# Patient Record
Sex: Male | Born: 1970 | Race: White | Hispanic: No | Marital: Married | Smoking: Never smoker
Health system: Southern US, Community
[De-identification: ages and names within clinical notes are randomized; demographics above are authoritative.]

## PROBLEM LIST (undated history)

## (undated) HISTORY — PX: CHOLECYSTECTOMY: SHX55

---

## 2013-06-27 ENCOUNTER — Emergency Department (HOSPITAL_COMMUNITY): Payer: Medicare Other

## 2013-06-27 ENCOUNTER — Encounter (HOSPITAL_COMMUNITY): Payer: Self-pay | Admitting: Emergency Medicine

## 2013-06-27 ENCOUNTER — Emergency Department (HOSPITAL_COMMUNITY)
Admission: EM | Admit: 2013-06-27 | Discharge: 2013-06-27 | Disposition: A | Discharge status: Home / Self Care | Payer: Medicare Other | Attending: Emergency Medicine | Admitting: Emergency Medicine

## 2013-06-27 DIAGNOSIS — Z791 Long term (current) use of non-steroidal anti-inflammatories (NSAID): Secondary | ICD-10-CM | POA: Insufficient documentation

## 2013-06-27 DIAGNOSIS — Z8781 Personal history of (healed) traumatic fracture: Secondary | ICD-10-CM

## 2013-06-27 DIAGNOSIS — S8290XD Unspecified fracture of unspecified lower leg, subsequent encounter for closed fracture with routine healing: Secondary | ICD-10-CM | POA: Insufficient documentation

## 2013-06-27 DIAGNOSIS — Z79899 Other long term (current) drug therapy: Secondary | ICD-10-CM | POA: Insufficient documentation

## 2013-06-27 MED ORDER — IBUPROFEN 800 MG PO TABS: 800.0000 mg | ORAL_TABLET | Freq: Three times a day (TID) | ORAL | Status: DC

## 2013-06-27 MED ORDER — IBUPROFEN 800 MG PO TABS: 800.0000 mg | ORAL_TABLET | Freq: Three times a day (TID) | ORAL | Status: AC

## 2013-06-27 MED ORDER — IBUPROFEN 800 MG PO TABS
800.0000 mg | ORAL_TABLET | Freq: Once | ORAL | Status: AC
  Administered 2013-06-27: 800 mg via ORAL
  Filled 2013-06-27: qty 1

## 2013-06-27 MED ORDER — HYDROCODONE-ACETAMINOPHEN 5-325 MG PO TABS: 1.0000 | ORAL_TABLET | ORAL | Status: AC | PRN

## 2013-06-27 MED ORDER — BACLOFEN 20 MG PO TABS
40.0000 mg | ORAL_TABLET | Freq: Three times a day (TID) | ORAL | Status: DC
  Administered 2013-06-27: 40 mg via ORAL
  Filled 2013-06-27 (×3): qty 2

## 2013-06-27 MED ORDER — HYDROCODONE-ACETAMINOPHEN 5-325 MG PO TABS: 1.0000 | ORAL_TABLET | ORAL | Status: DC | PRN

## 2013-06-27 NOTE — ED Notes (Addendum)
Pt. requesting evaluation / splinting of his left ankle , denies recent injury or fall . Pt. is paraplegic uses a wheelchair .

## 2013-06-27 NOTE — Discharge Instructions (Signed)
Call for a follow up appointment with a Family or Primary Care Provider.  Call the Orthopedist surgeon that you were [place] in [place]. Do not remove the splint until instructed to by the Orthopedist. Return if Symptoms worsen.   Take medication as prescribed.
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
## 2013-06-27 NOTE — Progress Notes (Signed)
Orthopedic Tech Progress Note Patient Details:  Sherilyn CooterRoger Givan 06/30/1970 [id number]  Ortho Devices Type of Ortho Device: Post (short leg) splint;Ace wrap Ortho Device/Splint Interventions: Application   Cammer, Mickie BailJennifer Carol 06/27/2013, 8:44 AM

## 2013-06-29 NOTE — ED Provider Notes (Signed)
Medical screening examination/treatment/procedure(s) were performed by non-physician practitioner and as supervising physician I was immediately available for consultation/collaboration.   EKG Interpretation None       Olivia Mackielga M Ilisha Blust, MD 06/29/13 [id number]

## 2014-11-25 IMAGING — CR DG ANKLE COMPLETE 3+V*L*
3 series · 3 of 3 positions shown · non-contrast
Comparison: None available

CLINICAL DATA: Hairline fracture 1 week ago.  With pain.

EXAM:
LEFT ANKLE COMPLETE - 3+ VIEW

[t ankle joint ap left]
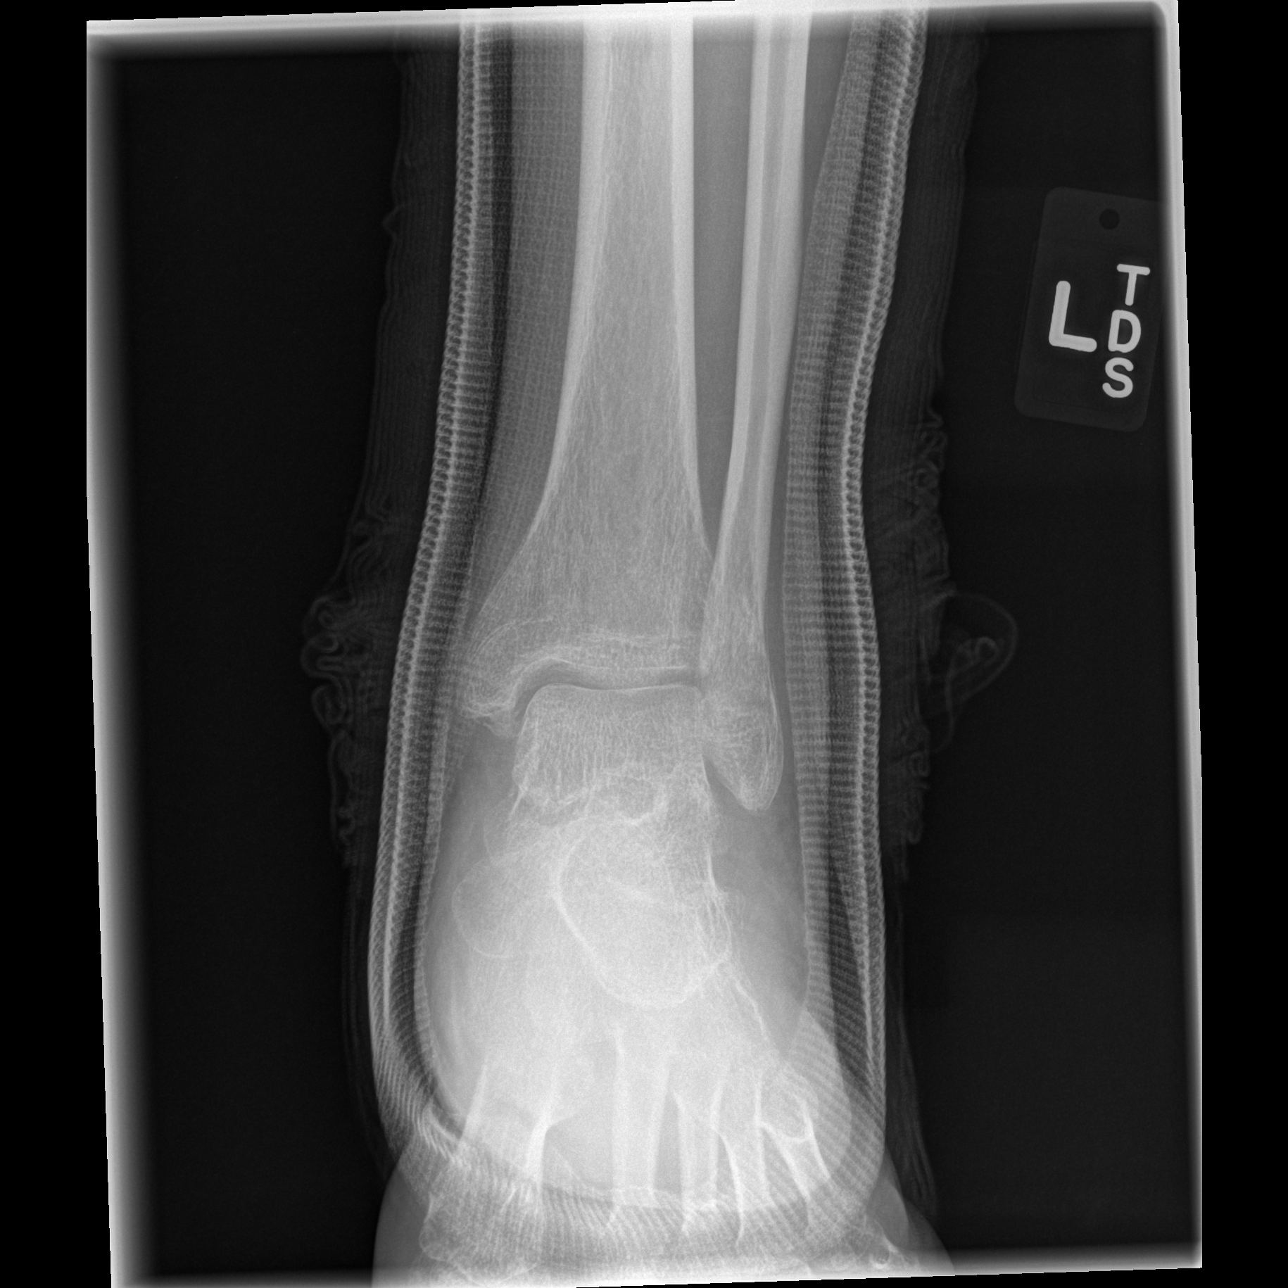

[t ankle joint oblique left]
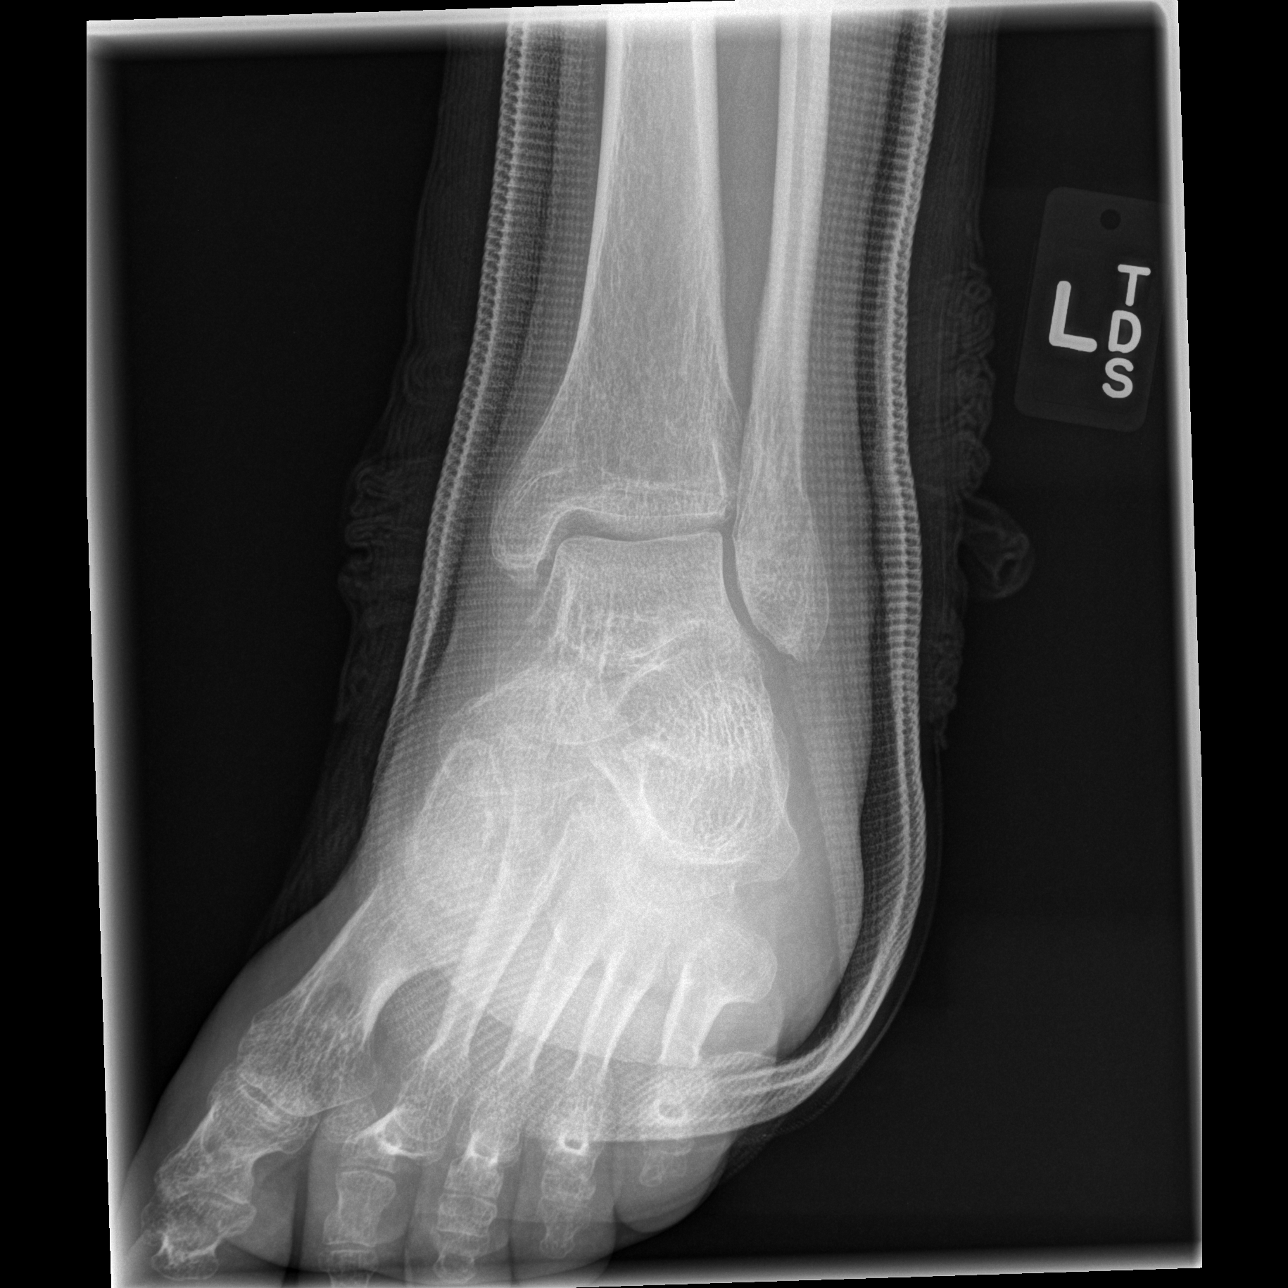

[t ankle joint lat left]
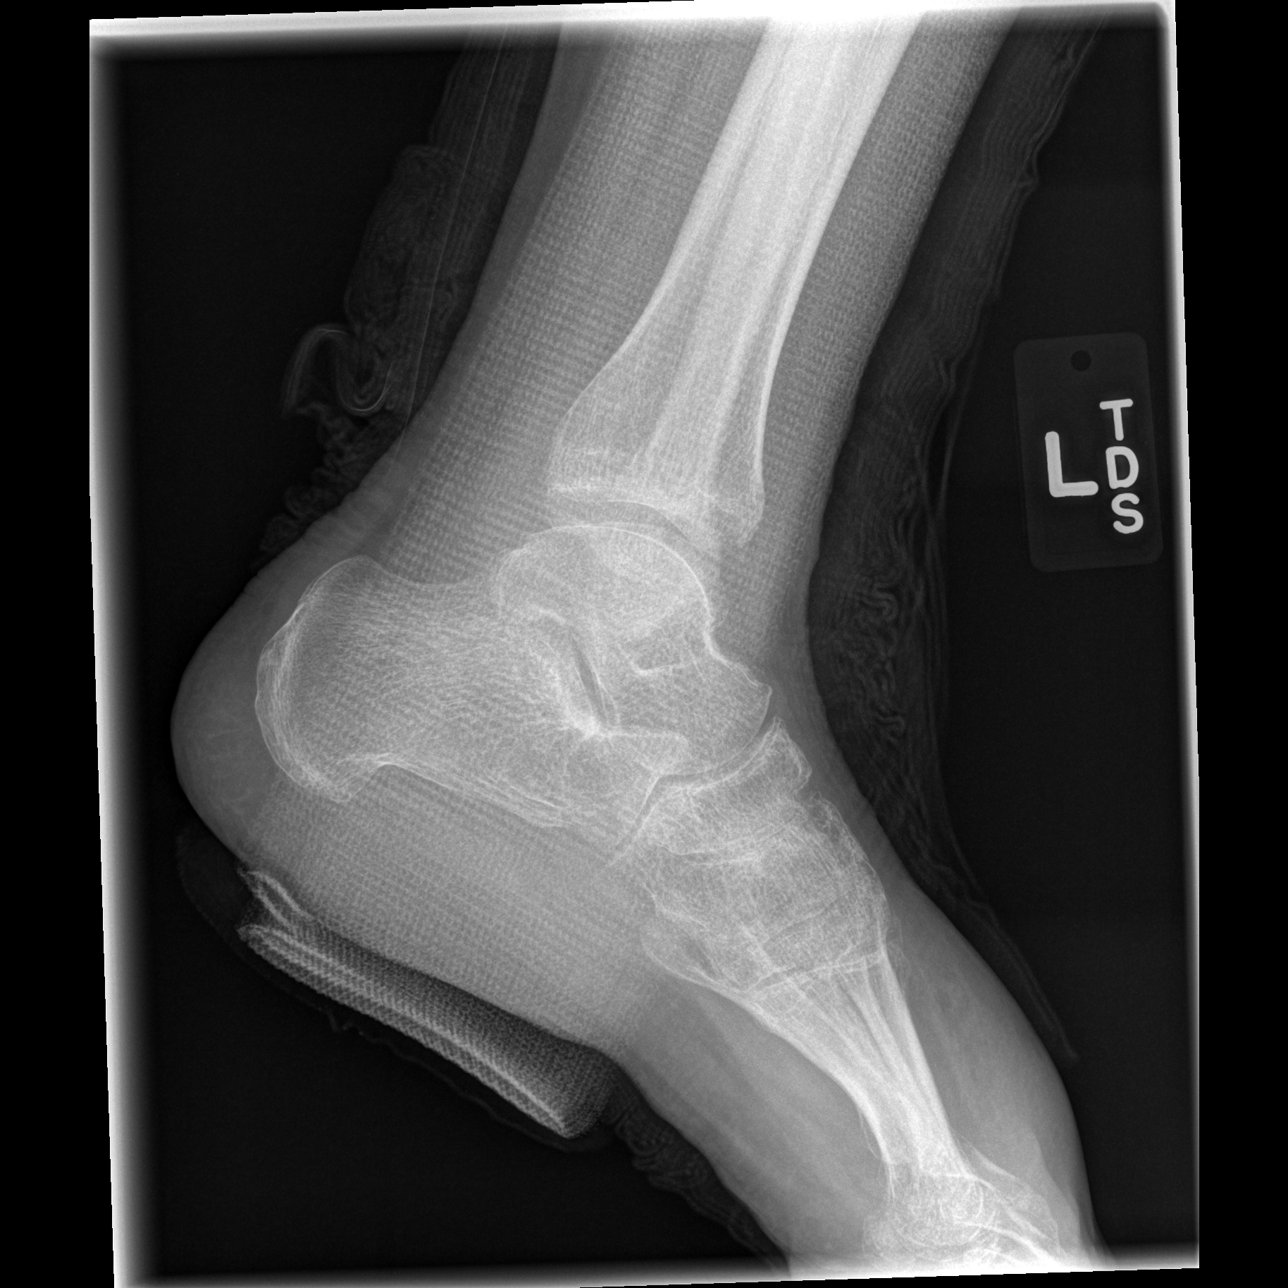

[3 of 3 positions shown; findings below may reference images not displayed]

FINDINGS: Splinting material overlies the left ankle, limiting evaluation for
fine osseous detail. There is a thin linear lucency traversing the
distal left fibula, likely an acute/subacute fracture. No other
definite fractures identified. Ankle mortise is approximated.
Diffuse soft tissue swelling present about the ankle.

Diffuse osteopenia is present.
IMPRESSION: 1. Thin linear lucency traversing the distal left fibula, suspicious
for acute/ subacute nondisplaced fracture. No dislocation or
malalignment about the ankle.
2. Splinting material overlying the left ankle, limiting evaluation
for fine osseous detail.
3. Diffuse osteopenia.
